# Patient Record
Sex: Female | Born: 1975 | Race: Asian | Hispanic: No | Marital: Married | Smoking: Never smoker
Health system: Southern US, Community
[De-identification: ages and names within clinical notes are randomized; demographics above are authoritative.]

## PROBLEM LIST (undated history)

## (undated) DIAGNOSIS — Z789 Other specified health status: Secondary | ICD-10-CM

## (undated) HISTORY — PX: OTHER SURGICAL HISTORY: SHX169

## (undated) HISTORY — PX: BACK SURGERY: SHX140

---

## 2002-05-07 ENCOUNTER — Encounter: Admission: RE | Admit: 2002-05-07 | Discharge: 2002-08-05 | Payer: Self-pay | Admitting: *Deleted

## 2002-08-06 ENCOUNTER — Encounter: Admission: RE | Admit: 2002-08-06 | Discharge: 2002-11-04 | Payer: Self-pay | Admitting: *Deleted

## 2003-02-21 ENCOUNTER — Encounter: Payer: Self-pay | Admitting: Nephrology

## 2003-02-21 ENCOUNTER — Encounter: Admission: RE | Admit: 2003-02-21 | Discharge: 2003-02-21 | Payer: Self-pay | Admitting: Nephrology

## 2003-06-23 ENCOUNTER — Observation Stay (HOSPITAL_COMMUNITY): Admission: EM | Admit: 2003-06-23 | Discharge: 2003-06-24 | Payer: Self-pay | Admitting: *Deleted

## 2003-06-23 ENCOUNTER — Encounter: Payer: Self-pay | Admitting: *Deleted

## 2003-06-24 ENCOUNTER — Encounter: Payer: Self-pay | Admitting: General Surgery

## 2005-05-04 ENCOUNTER — Emergency Department (HOSPITAL_COMMUNITY): Admission: EM | Admit: 2005-05-04 | Discharge: 2005-05-04 | Payer: Self-pay | Admitting: Emergency Medicine
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
## 2007-12-11 ENCOUNTER — Inpatient Hospital Stay (HOSPITAL_COMMUNITY): Admission: AD | Admit: 2007-12-11 | Discharge: 2007-12-14 | Payer: Self-pay | Admitting: Obstetrics and Gynecology

## 2009-03-06 IMAGING — CR DG ABD PORTABLE 1V
1 series · 1 of 1 positions shown · non-contrast
Comparison: none

CLINICAL DATA: 31-year-old female in labor.  Evaluate for location of spinal hardware for epidural placement.
PORTABLE ABDOMEN - 1 VIEW - 12/12/07:

[view not recorded]
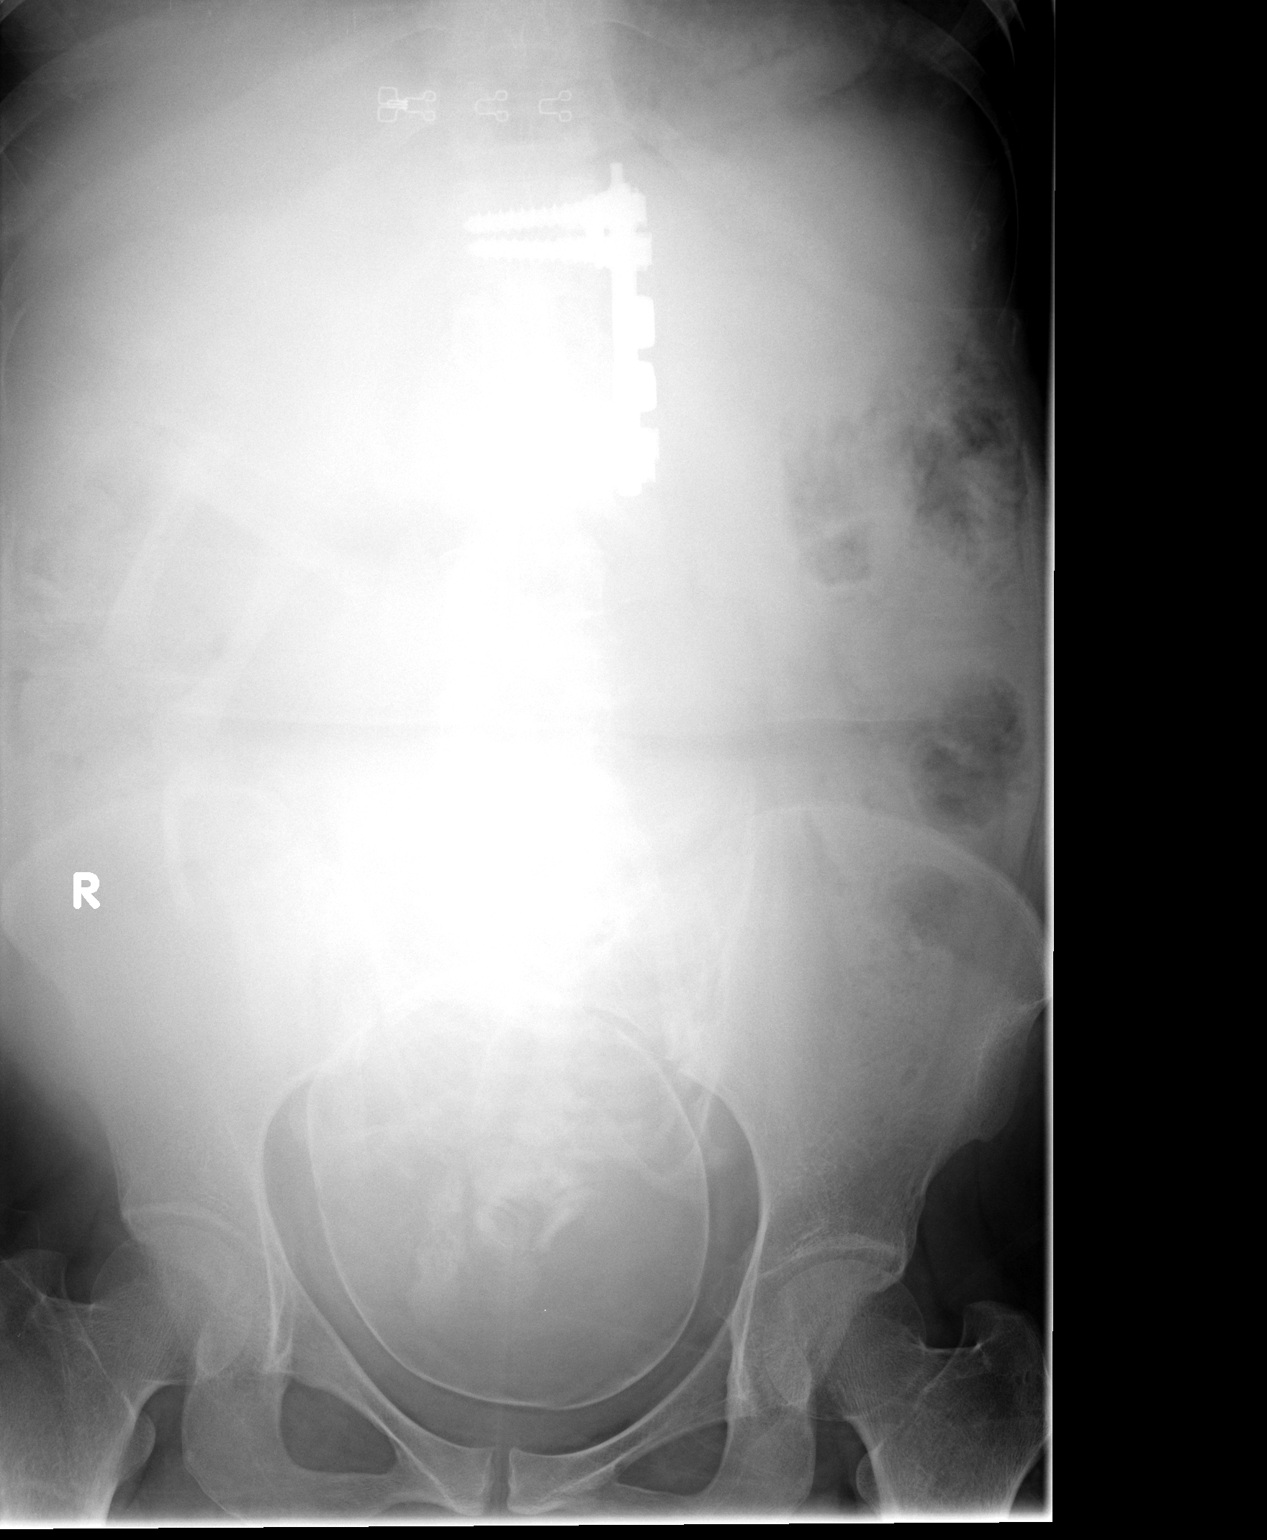

[1 of 1 positions shown; findings below may reference images not displayed]

FINDINGS: There is a fetus in vertex position.  There is spinal hardware present from a left lateral fixation with the superior fixation screws in the last rib-bearing vertebral body which will be designated T12.  The inferior fixation screws are in L2 .
IMPRESSION: Lateral fusion from T12 to L2.

## 2010-04-07 ENCOUNTER — Inpatient Hospital Stay (HOSPITAL_COMMUNITY): Admission: AD | Admit: 2010-04-07 | Discharge: 2010-04-09 | Payer: Self-pay | Admitting: Obstetrics and Gynecology

## 2011-01-14 LAB — CBC
HCT: 34.8 % — ABNORMAL LOW (ref 36.0–46.0)
Hemoglobin: 11.9 g/dL — ABNORMAL LOW (ref 12.0–15.0)
MCV: 85.9 fL (ref 78.0–100.0)
Platelets: 170 10*3/uL (ref 150–400)
RBC: 4.84 MIL/uL (ref 3.87–5.11)
WBC: 10 10*3/uL (ref 4.0–10.5)
WBC: 11.2 10*3/uL — ABNORMAL HIGH (ref 4.0–10.5)
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
## 2011-07-19 LAB — CBC
HCT: 38.6
MCHC: 33.4
MCHC: 34.2
Platelets: 183
Platelets: 217
RBC: 4.08
RDW: 20.4 — ABNORMAL HIGH
RDW: 21.1 — ABNORMAL HIGH

## 2014-07-25 ENCOUNTER — Other Ambulatory Visit (HOSPITAL_COMMUNITY): Payer: Self-pay | Admitting: Respiratory Therapy

## 2014-07-25 DIAGNOSIS — R059 Cough, unspecified: Secondary | ICD-10-CM

## 2014-07-25 DIAGNOSIS — R05 Cough: Secondary | ICD-10-CM

## 2014-08-01 ENCOUNTER — Ambulatory Visit (HOSPITAL_COMMUNITY)
Admission: RE | Admit: 2014-08-01 | Discharge: 2014-08-01 | Disposition: A | Discharge status: Home / Self Care | Payer: BC Managed Care – PPO | Source: Ambulatory Visit | Attending: Family Medicine | Admitting: Family Medicine

## 2014-08-01 DIAGNOSIS — J45909 Unspecified asthma, uncomplicated: Secondary | ICD-10-CM | POA: Insufficient documentation

## 2014-08-01 DIAGNOSIS — R05 Cough: Secondary | ICD-10-CM | POA: Diagnosis present

## 2014-08-01 DIAGNOSIS — R062 Wheezing: Secondary | ICD-10-CM | POA: Diagnosis present

## 2014-08-01 LAB — PULMONARY FUNCTION TEST
DL/VA % PRED: 155 %
DL/VA: 6.6 ml/min/mmHg/L
DLCO COR % PRED: 79 %
DLCO UNC: 14.91 ml/min/mmHg
DLCO cor: 14.91 ml/min/mmHg
DLCO unc % pred: 79 %
FEF 25-75 POST: 2.03 L/s
FEF 25-75 Pre: 0.86 L/sec
FEF2575-%CHANGE-POST: 134 %
FEF2575-%PRED-POST: 66 %
FEF2575-%PRED-PRE: 28 %
FEV1-%Change-Post: 42 %
FEV1-%PRED-PRE: 44 %
FEV1-%Pred-Post: 63 %
FEV1-PRE: 1.22 L
FEV1-Post: 1.73 L
FEV1FVC-%Change-Post: 39 %
FEV1FVC-%PRED-PRE: 76 %
FEV6-%CHANGE-POST: 1 %
FEV6-%PRED-POST: 60 %
FEV6-%Pred-Pre: 59 %
FEV6-Post: 1.94 L
FEV6-Pre: 1.92 L
FEV6FVC-%Pred-Post: 101 %
FEV6FVC-%Pred-Pre: 101 %
FVC-%Change-Post: 1 %
FVC-%PRED-POST: 59 %
FVC-%Pred-Pre: 58 %
FVC-POST: 1.94 L
FVC-Pre: 1.92 L
POST FEV1/FVC RATIO: 89 %
PRE FEV1/FVC RATIO: 63 %
Post FEV6/FVC ratio: 100 %
Pre FEV6/FVC Ratio: 100 %
RV % pred: 97 %
RV: 1.28 L
TLC % pred: 69 %
TLC: 3.08 L

## 2014-08-01 MED ORDER — ALBUTEROL SULFATE (2.5 MG/3ML) 0.083% IN NEBU
2.5000 mg | INHALATION_SOLUTION | Freq: Once | RESPIRATORY_TRACT | Status: AC
  Administered 2014-08-01: 2.5 mg via RESPIRATORY_TRACT

## 2015-08-17 LAB — OB RESULTS CONSOLE RUBELLA ANTIBODY, IGM: RUBELLA: IMMUNE

## 2015-08-17 LAB — OB RESULTS CONSOLE HEPATITIS B SURFACE ANTIGEN: Hepatitis B Surface Ag: NEGATIVE

## 2015-08-17 LAB — OB RESULTS CONSOLE GC/CHLAMYDIA
CHLAMYDIA, DNA PROBE: NEGATIVE
GC PROBE AMP, GENITAL: NEGATIVE

## 2015-08-17 LAB — OB RESULTS CONSOLE ABO/RH: RH Type: POSITIVE

## 2015-08-17 LAB — OB RESULTS CONSOLE ANTIBODY SCREEN: Antibody Screen: NEGATIVE

## 2015-08-17 LAB — OB RESULTS CONSOLE RPR: RPR: NONREACTIVE

## 2015-08-17 LAB — OB RESULTS CONSOLE HIV ANTIBODY (ROUTINE TESTING): HIV: NONREACTIVE

## 2015-10-29 NOTE — L&D Delivery Note (Signed)
Delivery Note Pt arrived on L&D completely dilated and CNM called while I was coming to hospital.  I arrived as head was crowning and observed the CNM delivered the baby without difficulty.  I then assumed care and cut the cord after one minute delay and delivered the placenta intact.  At 9:11 AM a healthy female was delivered via Vaginal, Spontaneous Delivery (Presentation: OA).  APGAR: 9, 9; weight 8 lb 7.8 oz (3850 g).   Placenta status: Intact, Spontaneous.  Cord: 3 vessels with the following complications: None.   Anesthesia: None  Episiotomy: None Lacerations: None Suture Repair: n/a Est. Blood Loss (mL): 175ml  Mom to postpartum.  Baby to Couplet care / Skin to Skin. Pt declines circumcision We discussed interval tubal again and pt desires to proceed.  Oliver PilaRICHARDSON,Divit Stipp W 03/10/2016, 10:33 AM

## 2016-02-14 LAB — OB RESULTS CONSOLE GBS: STREP GROUP B AG: NEGATIVE

## 2016-02-24 ENCOUNTER — Inpatient Hospital Stay (HOSPITAL_COMMUNITY)
Admission: AD | Admit: 2016-02-24 | Discharge: 2016-02-25 | Disposition: A | Discharge status: Home / Self Care | Payer: Medicaid Other | Source: Ambulatory Visit | Attending: Obstetrics and Gynecology | Admitting: Obstetrics and Gynecology

## 2016-02-24 ENCOUNTER — Encounter (HOSPITAL_COMMUNITY): Payer: Self-pay | Admitting: *Deleted

## 2016-02-24 DIAGNOSIS — Z3493 Encounter for supervision of normal pregnancy, unspecified, third trimester: Secondary | ICD-10-CM | POA: Diagnosis present

## 2016-02-24 HISTORY — DX: Other specified health status: Z78.9

## 2016-02-24 NOTE — MAU Note (Signed)
Went to [facility] about ago and saw pink when wiped. Some contractions. Good FM. 4cm last sve .

## 2016-02-25 DIAGNOSIS — Z3493 Encounter for supervision of normal pregnancy, unspecified, third trimester: Secondary | ICD-10-CM | POA: Diagnosis not present

## 2016-02-25 LAB — URINALYSIS, ROUTINE W REFLEX MICROSCOPIC
BILIRUBIN URINE: NEGATIVE
GLUCOSE, UA: NEGATIVE mg/dL
Hgb urine dipstick: NEGATIVE
KETONES UR: NEGATIVE mg/dL
Leukocytes, UA: NEGATIVE
NITRITE: NEGATIVE
PH: 6.5 (ref 5.0–8.0)
PROTEIN: NEGATIVE mg/dL
Specific Gravity, Urine: 1.015 (ref 1.005–1.030)

## 2016-02-25 NOTE — Progress Notes (Signed)
Dr Jackelyn KnifeMeisinger notified of pt's admission and status. Aware of ctx pattern, sve x 2, reactive FHR. Pt does not appear uncomfortable and states feels could sleep thru ctxs. Stable for d/c home.

## 2016-02-25 NOTE — Discharge Instructions (Signed)
Vaginal Delivery °During delivery, your health care provider will help you give birth to your baby. During a vaginal delivery, you will work to push the baby out of your vagina. However, before you can push your baby out, a few things need to happen. The opening of your uterus (cervix) has to soften, thin out, and open up (dilate) all the way to 10 cm. Also, your baby has to move down from the uterus into your vagina.  °SIGNS OF LABOR  °Your health care provider will first need to make sure you are in labor. Signs of labor include:  °· Passing what is called the mucous plug before labor begins. This is a small amount of blood-stained mucus. °· Having regular, painful uterine contractions.   °· The time between contractions gets shorter.   °· The discomfort and pain gradually get more intense. °· Contraction pains get worse when walking and do not go away when resting.   °· Your cervix becomes thinner (effacement) and dilates. °BEFORE THE DELIVERY °Once you are in labor and admitted into the hospital or care center, your health care provider may do the following:  °· Perform a complete physical exam. °· Review any complications related to pregnancy or labor.  °· Check your blood pressure, pulse, temperature, and heart rate (vital signs).   °· Determine if, and when, the rupture of amniotic membranes occurred. °· Do a vaginal exam (using a sterile glove and lubricant) to determine:   °¨ The position (presentation) of the baby. Is the baby's head presenting first (vertex) in the birth canal (vagina), or are the feet or buttocks first (breech)?   °¨ The level (station) of the baby's head within the birth canal.   °¨ The effacement and dilatation of the cervix.   °· An electronic fetal monitor is usually placed on your abdomen when you first arrive. This is used to monitor your contractions and the baby's heart rate. °¨ When the monitor is on your abdomen (external fetal monitor), it can only pick up the frequency and  length of your contractions. It cannot tell the strength of your contractions. °¨ If it becomes necessary for your health care provider to know exactly how strong your contractions are or to see exactly what the baby's heart rate is doing, an internal monitor may be inserted into your vagina and uterus. Your health care provider will discuss the benefits and risks of using an internal monitor and obtain your permission before inserting the device. °¨ Continuous fetal monitoring may be needed if you have an epidural, are receiving certain medicines (such as oxytocin), or have pregnancy or labor complications. °· An IV access tube may be placed into a vein in your arm to deliver fluids and medicines if necessary. °THREE STAGES OF LABOR AND DELIVERY °Normal labor and delivery is divided into three stages. °First Stage °This stage starts when you begin to contract regularly and your cervix begins to efface and dilate. It ends when your cervix is completely open (fully dilated). The first stage is the longest stage of labor and can last from 3 hours to 15 hours.  °Several methods are available to help with labor pain. You and your health care provider will decide which option is best for you. Options include:  °· Opioid medicines. These are strong pain medicines that you can get through your IV tube or as a shot into your muscle. These medicines lessen pain but do not make it go away completely.  °· Epidural. A medicine is given through a thin tube that   is inserted in your back. The medicine numbs the lower part of your body and prevents any pain in that area. °· Paracervical pain medicine. This is an injection of an anesthetic on each side of your cervix.   °· You may request natural childbirth, which does not involve the use of pain medicines or an epidural during labor and delivery. Instead, you will use other things, such as breathing exercises, to help cope with the pain. °Second Stage °The second stage of labor  begins when your cervix is fully dilated at 10 cm. It continues until you push your baby down through the birth canal and the baby is born. This stage can take only minutes or several hours. °· The location of your baby's head as it moves through the birth canal is reported as a number called a station. If the baby's head has not started its descent, the station is described as being at minus 3 (-3). When your baby's head is at the zero station, it is at the middle of the birth canal and is engaged in the pelvis. The station of your baby helps indicate the progress of the second stage of labor. °· When your baby is born, your health care provider may hold the baby with his or her head lowered to prevent amniotic fluid, mucus, and blood from getting into the baby's lungs. The baby's mouth and nose may be suctioned with a small bulb syringe to remove any additional fluid. °· Your health care provider may then place the baby on your stomach. It is important to keep the baby from getting cold. To do this, the health care provider will dry the baby off, place the baby directly on your skin (with no blankets between you and the baby), and cover the baby with warm, dry blankets.   °· The umbilical cord is cut. °Third Stage °During the third stage of labor, your health care provider will deliver the placenta (afterbirth) and make sure your bleeding is under control. The delivery of the placenta usually takes about 5 minutes but can take up to 30 minutes. After the placenta is delivered, a medicine may be given either by IV or injection to help contract the uterus and control bleeding. If you are planning to breastfeed, you can try to do so now. °After you deliver the placenta, your uterus should contract and get very firm. If your uterus does not remain firm, your health care provider will massage it. This is important because the contraction of the uterus helps cut off bleeding at the site where the placenta was attached  to your uterus. If your uterus does not contract properly and stay firm, you may continue to bleed heavily. If there is a lot of bleeding, medicines may be given to contract the uterus and stop the bleeding.  °  °This information is not intended to replace advice given to you by your health care provider. Make sure you discuss any questions you have with your health care provider. °  °Document Released: 07/23/2008 Document Revised: 11/04/2014 Document Reviewed: 06/10/2012 °Elsevier Interactive Patient Education ©2016 [employer] Inc[employer] ° °

## 2016-02-25 NOTE — Progress Notes (Signed)
Written and verbal d/c instructions given and understanding voiced. 

## 2016-03-08 ENCOUNTER — Telehealth (HOSPITAL_COMMUNITY): Payer: Self-pay | Admitting: *Deleted

## 2016-03-08 ENCOUNTER — Encounter (HOSPITAL_COMMUNITY): Payer: Self-pay | Admitting: *Deleted

## 2016-03-08 NOTE — Telephone Encounter (Signed)
Preadmission screen [phone] interpreter number

## 2016-03-10 ENCOUNTER — Inpatient Hospital Stay (HOSPITAL_COMMUNITY)
Admission: AD | Admit: 2016-03-10 | Discharge: 2016-03-11 | DRG: 775 | Disposition: A | Discharge status: Home / Self Care | Payer: Medicaid Other | Source: Ambulatory Visit | Attending: Obstetrics and Gynecology | Admitting: Obstetrics and Gynecology

## 2016-03-10 ENCOUNTER — Encounter (HOSPITAL_COMMUNITY): Payer: Self-pay

## 2016-03-10 DIAGNOSIS — Z3A39 39 weeks gestation of pregnancy: Secondary | ICD-10-CM | POA: Diagnosis not present

## 2016-03-10 DIAGNOSIS — Z8249 Family history of ischemic heart disease and other diseases of the circulatory system: Secondary | ICD-10-CM | POA: Diagnosis not present

## 2016-03-10 DIAGNOSIS — Z833 Family history of diabetes mellitus: Secondary | ICD-10-CM

## 2016-03-10 LAB — CBC
HCT: 39.1 % (ref 36.0–46.0)
Hemoglobin: 12.8 g/dL (ref 12.0–15.0)
MCH: 26.4 pg (ref 26.0–34.0)
MCHC: 32.7 g/dL (ref 30.0–36.0)
MCV: 80.8 fL (ref 78.0–100.0)
PLATELETS: 168 10*3/uL (ref 150–400)
RBC: 4.84 MIL/uL (ref 3.87–5.11)
RDW: 15.3 % (ref 11.5–15.5)
WBC: 13.6 10*3/uL — ABNORMAL HIGH (ref 4.0–10.5)

## 2016-03-10 LAB — ABO/RH: ABO/RH(D): AB POS

## 2016-03-10 LAB — TYPE AND SCREEN
ABO/RH(D): AB POS
Antibody Screen: NEGATIVE

## 2016-03-10 MED ORDER — LACTATED RINGERS IV SOLN: INTRAVENOUS | Status: DC

## 2016-03-10 MED ORDER — ONDANSETRON HCL 4 MG/2ML IJ SOLN: 4.0000 mg | INTRAMUSCULAR | Status: DC | PRN

## 2016-03-10 MED ORDER — SENNOSIDES-DOCUSATE SODIUM 8.6-50 MG PO TABS
2.0000 | ORAL_TABLET | ORAL | Status: DC
Start: 2016-03-11 — End: 2016-03-11
  Administered 2016-03-10: 2 via ORAL
  Filled 2016-03-10: qty 2

## 2016-03-10 MED ORDER — ONDANSETRON HCL 4 MG/2ML IJ SOLN: 4.0000 mg | Freq: Four times a day (QID) | INTRAMUSCULAR | Status: DC | PRN

## 2016-03-10 MED ORDER — OXYTOCIN 40 UNITS IN LACTATED RINGERS INFUSION - SIMPLE MED: 2.5000 [IU]/h | INTRAVENOUS | Status: DC

## 2016-03-10 MED ORDER — COCONUT OIL OIL: 1.0000 "application " | TOPICAL_OIL | Status: DC | PRN

## 2016-03-10 MED ORDER — LACTATED RINGERS IV SOLN: 500.0000 mL | INTRAVENOUS | Status: DC | PRN

## 2016-03-10 MED ORDER — DIPHENHYDRAMINE HCL 25 MG PO CAPS: 25.0000 mg | ORAL_CAPSULE | Freq: Four times a day (QID) | ORAL | Status: DC | PRN

## 2016-03-10 MED ORDER — FLEET ENEMA 7-19 GM/118ML RE ENEM: 1.0000 | ENEMA | Freq: Once | RECTAL | Status: DC

## 2016-03-10 MED ORDER — LIDOCAINE HCL (PF) 1 % IJ SOLN
30.0000 mL | INTRAMUSCULAR | Status: DC | PRN
  Filled 2016-03-10: qty 30

## 2016-03-10 MED ORDER — IBUPROFEN 600 MG PO TABS
600.0000 mg | ORAL_TABLET | Freq: Four times a day (QID) | ORAL | Status: DC
  Administered 2016-03-10 – 2016-03-11 (×5): 600 mg via ORAL
  Filled 2016-03-10 (×5): qty 1

## 2016-03-10 MED ORDER — ACETAMINOPHEN 325 MG PO TABS: 650.0000 mg | ORAL_TABLET | ORAL | Status: DC | PRN

## 2016-03-10 MED ORDER — BENZOCAINE-MENTHOL 20-0.5 % EX AERO: 1.0000 "application " | INHALATION_SPRAY | CUTANEOUS | Status: DC | PRN

## 2016-03-10 MED ORDER — WITCH HAZEL-GLYCERIN EX PADS: 1.0000 "application " | MEDICATED_PAD | CUTANEOUS | Status: DC | PRN

## 2016-03-10 MED ORDER — DIBUCAINE 1 % RE OINT: 1.0000 "application " | TOPICAL_OINTMENT | RECTAL | Status: DC | PRN

## 2016-03-10 MED ORDER — SIMETHICONE 80 MG PO CHEW: 80.0000 mg | CHEWABLE_TABLET | ORAL | Status: DC | PRN

## 2016-03-10 MED ORDER — OXYTOCIN 10 UNIT/ML IJ SOLN
INTRAMUSCULAR | Status: AC
  Administered 2016-03-10: 10 [IU] via INTRAMUSCULAR
  Filled 2016-03-10: qty 1

## 2016-03-10 MED ORDER — OXYTOCIN 10 UNIT/ML IJ SOLN: 10.0000 [IU] | Freq: Once | INTRAMUSCULAR | Status: DC

## 2016-03-10 MED ORDER — PRENATAL MULTIVITAMIN CH
1.0000 | ORAL_TABLET | Freq: Every day | ORAL | Status: DC
  Administered 2016-03-10 – 2016-03-11 (×2): 1 via ORAL
  Filled 2016-03-10 (×2): qty 1

## 2016-03-10 MED ORDER — CITRIC ACID-SODIUM CITRATE 334-500 MG/5ML PO SOLN: 30.0000 mL | ORAL | Status: DC | PRN

## 2016-03-10 MED ORDER — TETANUS-DIPHTH-ACELL PERTUSSIS 5-2.5-18.5 LF-MCG/0.5 IM SUSP: 0.5000 mL | Freq: Once | INTRAMUSCULAR | Status: DC

## 2016-03-10 MED ORDER — OXYCODONE-ACETAMINOPHEN 5-325 MG PO TABS: 1.0000 | ORAL_TABLET | ORAL | Status: DC | PRN

## 2016-03-10 MED ORDER — ONDANSETRON HCL 4 MG PO TABS: 4.0000 mg | ORAL_TABLET | ORAL | Status: DC | PRN

## 2016-03-10 MED ORDER — ZOLPIDEM TARTRATE 5 MG PO TABS: 5.0000 mg | ORAL_TABLET | Freq: Every evening | ORAL | Status: DC | PRN

## 2016-03-10 MED ORDER — OXYTOCIN BOLUS FROM INFUSION: 500.0000 mL | INTRAVENOUS | Status: DC

## 2016-03-10 MED ORDER — OXYCODONE-ACETAMINOPHEN 5-325 MG PO TABS: 2.0000 | ORAL_TABLET | ORAL | Status: DC | PRN

## 2016-03-10 NOTE — MAU Note (Signed)
Pt presents to MAU with contractions that started at 2 am. Pt presents completely dilated

## 2016-03-10 NOTE — H&P (Addendum)
[name] is a 40 y.o. female G3P2002 at 3339 5/7 weeks (EDD 03/12/16 by LMP c/w 8 week US) presenting to MAU completely dilated and taken immediately to L&D where she delivered within 15 minutes of arrival.  Prenatal care significant for AMA with normal Panorama screen, low risk female.  She expressed a desire for permanent sterilization at [redacted] weeks gestation and signed BTL papers 02/13/16.  We discussed this would likely be an interval tubal and she was fine with that plan.      Maternal Medical History:  Reason for admission: Contractions.   Contractions: Onset was 3-5 hours ago.   Frequency: regular.   Perceived severity is strong.    Fetal activity: Perceived fetal activity is normal.    Prenatal Complications - Diabetes: none.    OB History    Gravida Para Term Preterm AB TAB SAB Ectopic Multiple Living   3 2 2       2     2009 7#  NSVD 2011 7#12oz NSVD  Past Medical History  Diagnosis Date  . Medical history non-contributory    Past Surgical History  Procedure Laterality Date  . Back surgery    . Tummy tuck     Family History: family history includes Cancer in her mother; Diabetes in her father; Hypertension in her father. Social History:  reports that she has never smoked. She has never used smokeless tobacco. She reports that she does not drink alcohol or use illicit drugs.   Prenatal Transfer Tool  Maternal Diabetes: No Genetic Screening: Normal Maternal Ultrasounds/Referrals: Normal Fetal Ultrasounds or other Referrals:  None Maternal Substance Abuse:  No Significant Maternal Medications:  None Significant Maternal Lab Results:  None Other Comments:  None  Review of Systems  Gastrointestinal: Positive for abdominal pain.    Dilation: 10 Exam by:: Dorisann FramesAmanda Jones RN Blood pressure 125/74, pulse 76, temperature 98.2 F (36.8 C), temperature source Oral, resp. rate 18, height 5\' 2"  (1.575 m), weight 178 lb (80.74 kg). Maternal Exam:  Uterine Assessment:  Contraction strength is firm.  Contraction frequency is regular.   Abdomen: Fetal presentation: vertex  Introitus: Normal vulva. Normal vagina.    Physical Exam  Constitutional: She is oriented to person, place, and time. She appears well-developed and well-nourished.  Cardiovascular: Normal rate.   Respiratory: Effort normal.  GI: Soft.  Genitourinary: Vagina normal.  Neurological: She is alert and oriented to person, place, and time.  Psychiatric: She has a normal mood and affect.    Prenatal labs: ABO, Rh: AB/Positive/-- (10/20 0000) Antibody: Negative (10/20 0000) Rubella: Immune (10/20 0000) RPR: Nonreactive (10/20 0000)  HBsAg: Negative (10/20 0000)  HIV: Non-reactive (10/20 0000)  GBS: Negative (04/19 0000)  One hour GCT 154 Three hour GTT WNL Panorama and AFP WNL  Assessment/Plan: Pt arrived and precipitously delivered se that note.  Plan interval BTL.   Oliver PilaRICHARDSON,Yahya Boldman W 03/10/2016, 10:23 AM

## 2016-03-10 NOTE — Progress Notes (Signed)
Dr Senaida Oresichardson notified of pt VE complete, orders received

## 2016-03-11 LAB — CBC
HEMATOCRIT: 36.3 % (ref 36.0–46.0)
Hemoglobin: 11.8 g/dL — ABNORMAL LOW (ref 12.0–15.0)
MCH: 25.9 pg — AB (ref 26.0–34.0)
MCHC: 32.5 g/dL (ref 30.0–36.0)
MCV: 79.8 fL (ref 78.0–100.0)
PLATELETS: 161 10*3/uL (ref 150–400)
RBC: 4.55 MIL/uL (ref 3.87–5.11)
RDW: 15.6 % — ABNORMAL HIGH (ref 11.5–15.5)
WBC: 10.4 10*3/uL (ref 4.0–10.5)

## 2016-03-11 LAB — RPR: RPR Ser Ql: NONREACTIVE

## 2016-03-11 MED ORDER — IBUPROFEN 600 MG PO TABS: 600.0000 mg | ORAL_TABLET | Freq: Four times a day (QID) | ORAL | Status: AC

## 2016-03-11 NOTE — Discharge Summary (Signed)
OB Discharge Summary     Patient Name: [name] DOB: Mar 06, 1976 MRN: [id number]  Date of admission: 03/10/2016 Delivering MD: Zerita BoersLAWSON, DARLENE   Date of discharge: 03/11/2016  Admitting diagnosis: 39WKS CONTRACTIONS BLEEDING  Intrauterine pregnancy: 8667w5d     Secondary diagnosis:  Active Problems:   Labor and delivery indication for care or intervention   Precipitous delivery  Additional problems: none     Discharge diagnosis: Term Pregnancy Delivered                                                                                                Post partum procedures:none  Augmentation: none  Complications: None  Hospital course:  Onset of Labor With Vaginal Delivery     40 y.o. yo G3P3003 at 1167w5d was admitted in Active Labor on 03/10/2016. Patient had an uncomplicated labor course as follows:  Membrane Rupture Time/Date: 9:09 AM ,03/10/2016   Intrapartum Procedures: Episiotomy: None [1]                                         Lacerations:  None [1]  Patient had a delivery of a Viable infant.  Precipitous delivery 03/10/2016  Information for the patient's newborn:  Hyman Hopeshan, Boy Richie [[id number]][[id number]]  Delivery Method: Vaginal, Spontaneous Delivery (Filed from Delivery Summary)    Pateint had an uncomplicated postpartum course.  She is ambulating, tolerating a regular diet, passing flatus, and urinating well. Patient is discharged home in stable condition on 03/11/2016.    Physical exam  Filed Vitals:   03/10/16 1033 03/10/16 1133 03/10/16 1510 03/11/16 0606  BP: 128/72 122/73 107/63 109/64  Pulse: 78 76 75 71  Temp: 98.2 F (36.8 C) 99 F (37.2 C) 98.9 F (37.2 C) 98.2 F (36.8 C)  TempSrc: Oral Oral Oral Oral  Resp: 18 18 20 18   Height:      Weight:      SpO2:  98% 98%    General: alert and cooperative Lochia: appropriate Uterine Fundus: firm  Labs: Lab Results  Component Value Date   WBC 10.4 03/11/2016   HGB 11.8* 03/11/2016   HCT 36.3 03/11/2016   MCV  79.8 03/11/2016   PLT 161 03/11/2016   No flowsheet data found.  Discharge instruction: per After Visit Summary and "Baby and Me Booklet".  After visit meds:    Medication List    TAKE these medications        ibuprofen 600 MG tablet  Commonly known as:  ADVIL,MOTRIN  Take 1 tablet (600 mg total) by mouth every 6 (six) hours.     loratadine 10 MG tablet  Commonly known as:  CLARITIN  Take 10 mg by mouth daily.     prenatal multivitamin Tabs tablet  Take 1 tablet by mouth daily at 12 noon.        Diet: routine diet  Activity: Advance as tolerated. Pelvic rest for 6 weeks.   Outpatient follow up:6 weeks Follow up Appt:No future appointments. Follow up Visit:No Follow-up  on file.  Postpartum contraception: Tubal Ligation planned  Newborn Data: Live born female  Birth Weight: 8 lb 7.8 oz (3850 g) APGAR: 9, 9  Baby Feeding: Breast Disposition:home with mother   03/11/2016 Oliver Pila, MD

## 2016-03-11 NOTE — Progress Notes (Signed)
Post Partum Day 1 Subjective: no complaints, up ad lib and tolerating PO  Objective: Blood pressure 109/64, pulse 71, temperature 98.2 F (36.8 C), temperature source Oral, resp. rate 18, height 5\' 2"  (1.575 m), weight 178 lb (80.74 kg), SpO2 98 %, unknown if currently breastfeeding.  Physical Exam:  General: alert and cooperative Lochia: appropriate Uterine Fundus: firm    Recent Labs  03/10/16 0942 03/11/16 0539  HGB 12.8 11.8*  HCT 39.1 36.3    Assessment/Plan: Discharge home if baby able to go   LOS: 1 day   Alleen Kehm W 03/11/2016, 8:55 AM

## 2016-03-12 ENCOUNTER — Inpatient Hospital Stay (HOSPITAL_COMMUNITY): Admission: RE | Admit: 2016-03-12 | Payer: Medicaid Other | Source: Ambulatory Visit

## 2016-04-18 NOTE — Patient Instructions (Signed)
Your procedure is scheduled on:  Wednesday, May 01, 2016  [facility]:  6:00 AM  Pick up the phone at the desk and dial [phone].  Call this number if you have problems the morning of surgery: [phone].  Remember: Do NOT eat food or drink after:  Midnight Tuesday, April 30, 2016  Take these medicines the morning of surgery with a SIP OF WATER:  None  Do NOT wear jewelry (body piercing), metal hair clips/bobby pins, make-up, or nail polish. Do NOT wear lotions, powders, or perfumes.  You may wear deodorant. Do NOT shave for 48 hours prior to surgery. Do NOT bring valuables to the hospital. Contacts, dentures, or bridgework may not be worn into surgery.  Have a responsible adult drive you home and stay with you for 24 hours after your procedure

## 2016-04-22 ENCOUNTER — Encounter (HOSPITAL_COMMUNITY)
Admission: RE | Admit: 2016-04-22 | Discharge: 2016-04-22 | Disposition: A | Discharge status: Home / Self Care | Payer: Medicaid Other | Source: Ambulatory Visit | Attending: Obstetrics and Gynecology | Admitting: Obstetrics and Gynecology

## 2016-04-22 ENCOUNTER — Encounter (HOSPITAL_COMMUNITY): Payer: Self-pay

## 2016-04-22 DIAGNOSIS — Z01812 Encounter for preprocedural laboratory examination: Secondary | ICD-10-CM | POA: Insufficient documentation

## 2016-04-22 LAB — CBC
HEMATOCRIT: 40.1 % (ref 36.0–46.0)
HEMOGLOBIN: 13.3 g/dL (ref 12.0–15.0)
MCH: 26.5 pg (ref 26.0–34.0)
MCHC: 33.2 g/dL (ref 30.0–36.0)
MCV: 80 fL (ref 78.0–100.0)
Platelets: 224 10*3/uL (ref 150–400)
RBC: 5.01 MIL/uL (ref 3.87–5.11)
RDW: 16.4 % — AB (ref 11.5–15.5)
WBC: 6.7 10*3/uL (ref 4.0–10.5)

## 2016-04-22 LAB — TYPE AND SCREEN
ABO/RH(D): AB POS
ANTIBODY SCREEN: NEGATIVE

## 2016-04-30 NOTE — H&P (Signed)
[name] is an 40 y.o. female G3P3 who presents for scheduled laparoscopic tubal fulguration.  Pt desires permanent sterilization and is s/p the recent delivery of her  third child.  She is healthy otherwise.  She is bottlefeeding.  Pertinent Gynecological History: OB History: NSVD x 3   Menstrual History:  No LMP recorded.    Past Medical History  Diagnosis Date  . Medical history non-contributory     Past Surgical History  Procedure Laterality Date  . Back surgery    . Tummy tuck      Family History  Problem Relation Age of Onset  . Cancer Mother   . Hypertension Father   . Diabetes Father     Social History:  reports that she has never smoked. She has never used smokeless tobacco. She reports that she does not drink alcohol or use illicit drugs.  Allergies: No Known Allergies  No prescriptions prior to admission    Review of Systems  Gastrointestinal: Negative for abdominal pain.    not currently breastfeeding. Physical Exam  Constitutional: She appears well-developed.  Cardiovascular: Normal rate and regular rhythm.   Respiratory: Effort normal.  GI: Soft.  Abdominoplasty scar  Genitourinary: Vagina normal.  Neurological: She is alert.  Psychiatric: She has a normal mood and affect.    No results found for this or any previous visit (from the past 24 hour(s)).  No results found.  Assessment/Plan: The pt was counseled on  laparoscopic tubal fulguration in detail. d/Rowe her risks and benefits including bleeding, infection, and possible damage to bowel and bladder. We discussed that should a complication arise she might need a larger incision and have a much longer recovery. We discussed the risk of failure of 1/100 with pregnancy occuring and an increased risk of ectopic pregnancy should she conceive after the surgery. Pt desires to proceed.   Oliver PilaRICHARDSON,[name] 04/30/2016, 11:38 AM

## 2016-04-30 NOTE — Anesthesia Preprocedure Evaluation (Addendum)
Anesthesia Evaluation  Patient identified by MRN, date of birth, ID band Patient awake    Reviewed: Allergy & Precautions, H&P , NPO status , Patient's Chart, lab work & pertinent test results  History of Anesthesia Complications Negative for: history of anesthetic complications  Airway Mallampati: II  TM Distance: >3 FB Neck ROM: full    Dental no notable dental hx.    Pulmonary neg pulmonary ROS,    Pulmonary exam normal breath sounds clear to auscultation       Cardiovascular negative cardio ROS Normal cardiovascular exam Rhythm:regular Rate:Normal     Neuro/Psych negative neurological ROS     GI/Hepatic negative GI ROS, Neg liver ROS,   Endo/Other  negative endocrine ROS  Renal/GU negative Renal ROS     Musculoskeletal   Abdominal   Peds  Hematology negative hematology ROS (+)   Anesthesia Other Findings   Reproductive/Obstetrics negative OB ROS                             Anesthesia Physical Anesthesia Plan  ASA: I  Anesthesia Plan: General   Post-op Pain Management:    Induction: Intravenous  Airway Management Planned: Oral ETT  Additional Equipment:   Intra-op Plan:   Post-operative Plan: Extubation in OR  Informed Consent: I have reviewed the patients History and Physical, chart, labs and discussed the procedure including the risks, benefits and alternatives for the proposed anesthesia with the patient or authorized representative who has indicated his/her understanding and acceptance.   Dental Advisory Given  Plan Discussed with: Anesthesiologist, CRNA and Surgeon  Anesthesia Plan Comments: (Risks/benefits of general anesthesia discussed with patient including risk of damage to teeth, lips, gum, and tongue, nausea/vomiting, allergic reactions to medications, and the possibility of heart attack, stroke and death.  All patient questions answered.  Patient wishes to  proceed.  **[place] interpreter used during pre-op evaluation.**)       Anesthesia Quick Evaluation

## 2016-05-01 ENCOUNTER — Ambulatory Visit (HOSPITAL_COMMUNITY): Payer: Medicaid Other | Admitting: Anesthesiology

## 2016-05-01 ENCOUNTER — Encounter (HOSPITAL_COMMUNITY)
Admission: RE | Disposition: A | Discharge status: Home / Self Care | Payer: Self-pay | Source: Ambulatory Visit | Attending: Obstetrics and Gynecology

## 2016-05-01 ENCOUNTER — Encounter (HOSPITAL_COMMUNITY): Payer: Self-pay

## 2016-05-01 ENCOUNTER — Ambulatory Visit (HOSPITAL_COMMUNITY)
Admission: RE | Admit: 2016-05-01 | Discharge: 2016-05-01 | Disposition: A | Discharge status: Home / Self Care | Payer: Medicaid Other | Source: Ambulatory Visit | Attending: Obstetrics and Gynecology | Admitting: Obstetrics and Gynecology

## 2016-05-01 DIAGNOSIS — Z302 Encounter for sterilization: Secondary | ICD-10-CM | POA: Insufficient documentation

## 2016-05-01 HISTORY — PX: LAPAROSCOPIC TUBAL LIGATION: SHX1937

## 2016-05-01 LAB — PREGNANCY, URINE: PREG TEST UR: NEGATIVE

## 2016-05-01 SURGERY — LIGATION, FALLOPIAN TUBE, LAPAROSCOPIC
Anesthesia: General | Site: Abdomen | Laterality: Bilateral

## 2016-05-01 MED ORDER — FENTANYL CITRATE (PF) 100 MCG/2ML IJ SOLN: 25.0000 ug | INTRAMUSCULAR | Status: DC | PRN

## 2016-05-01 MED ORDER — PROMETHAZINE HCL 25 MG/ML IJ SOLN: 6.2500 mg | INTRAMUSCULAR | Status: DC | PRN

## 2016-05-01 MED ORDER — SCOPOLAMINE 1 MG/3DAYS TD PT72
1.0000 | MEDICATED_PATCH | Freq: Once | TRANSDERMAL | Status: DC
  Administered 2016-05-01: 1.5 mg via TRANSDERMAL

## 2016-05-01 MED ORDER — LIDOCAINE HCL (CARDIAC) 20 MG/ML IV SOLN
INTRAVENOUS | Status: AC
  Filled 2016-05-01: qty 5

## 2016-05-01 MED ORDER — DEXAMETHASONE SODIUM PHOSPHATE 10 MG/ML IJ SOLN
INTRAMUSCULAR | Status: DC | PRN
  Administered 2016-05-01: 10 mg via INTRAVENOUS

## 2016-05-01 MED ORDER — ONDANSETRON HCL 4 MG/2ML IJ SOLN
INTRAMUSCULAR | Status: AC
  Filled 2016-05-01: qty 2

## 2016-05-01 MED ORDER — PROPOFOL 10 MG/ML IV BOLUS
INTRAVENOUS | Status: AC
  Filled 2016-05-01: qty 20

## 2016-05-01 MED ORDER — FENTANYL CITRATE (PF) 250 MCG/5ML IJ SOLN
INTRAMUSCULAR | Status: AC
  Filled 2016-05-01: qty 5

## 2016-05-01 MED ORDER — SUGAMMADEX SODIUM 200 MG/2ML IV SOLN
INTRAVENOUS | Status: AC
  Filled 2016-05-01: qty 2

## 2016-05-01 MED ORDER — OXYCODONE-ACETAMINOPHEN 5-325 MG PO TABS: 1.0000 | ORAL_TABLET | ORAL | Status: AC | PRN

## 2016-05-01 MED ORDER — ONDANSETRON HCL 4 MG/2ML IJ SOLN
INTRAMUSCULAR | Status: DC | PRN
  Administered 2016-05-01: 4 mg via INTRAVENOUS

## 2016-05-01 MED ORDER — SUGAMMADEX SODIUM 200 MG/2ML IV SOLN
INTRAVENOUS | Status: DC | PRN
  Administered 2016-05-01: 134.2 mg via INTRAVENOUS

## 2016-05-01 MED ORDER — KETOROLAC TROMETHAMINE 30 MG/ML IJ SOLN
INTRAMUSCULAR | Status: DC | PRN
  Administered 2016-05-01: 30 mg via INTRAVENOUS

## 2016-05-01 MED ORDER — BUPIVACAINE HCL (PF) 0.25 % IJ SOLN
INTRAMUSCULAR | Status: DC | PRN
  Administered 2016-05-01: 5 mL

## 2016-05-01 MED ORDER — KETOROLAC TROMETHAMINE 30 MG/ML IJ SOLN
INTRAMUSCULAR | Status: AC
  Filled 2016-05-01: qty 1

## 2016-05-01 MED ORDER — BUPIVACAINE HCL (PF) 0.25 % IJ SOLN
INTRAMUSCULAR | Status: AC
  Filled 2016-05-01: qty 30

## 2016-05-01 MED ORDER — ESMOLOL HCL 100 MG/10ML IV SOLN
INTRAVENOUS | Status: AC
  Filled 2016-05-01: qty 10

## 2016-05-01 MED ORDER — SODIUM CHLORIDE 0.9 % IJ SOLN
INTRAMUSCULAR | Status: AC
  Filled 2016-05-01: qty 10

## 2016-05-01 MED ORDER — ROCURONIUM BROMIDE 100 MG/10ML IV SOLN
INTRAVENOUS | Status: AC
  Filled 2016-05-01: qty 1

## 2016-05-01 MED ORDER — SCOPOLAMINE 1 MG/3DAYS TD PT72
MEDICATED_PATCH | TRANSDERMAL | Status: AC
  Administered 2016-05-01: 1.5 mg via TRANSDERMAL
  Filled 2016-05-01: qty 1

## 2016-05-01 MED ORDER — MIDAZOLAM HCL 2 MG/2ML IJ SOLN
INTRAMUSCULAR | Status: AC
  Filled 2016-05-01: qty 2

## 2016-05-01 MED ORDER — LACTATED RINGERS IV SOLN: INTRAVENOUS | Status: DC

## 2016-05-01 MED ORDER — PROPOFOL 10 MG/ML IV BOLUS
INTRAVENOUS | Status: DC | PRN
  Administered 2016-05-01: 150 mg via INTRAVENOUS

## 2016-05-01 MED ORDER — ROCURONIUM BROMIDE 100 MG/10ML IV SOLN
INTRAVENOUS | Status: DC | PRN
  Administered 2016-05-01: 50 mg via INTRAVENOUS

## 2016-05-01 MED ORDER — MIDAZOLAM HCL 5 MG/5ML IJ SOLN
INTRAMUSCULAR | Status: DC | PRN
  Administered 2016-05-01: 2 mg via INTRAVENOUS

## 2016-05-01 MED ORDER — FENTANYL CITRATE (PF) 100 MCG/2ML IJ SOLN
INTRAMUSCULAR | Status: DC | PRN
  Administered 2016-05-01 (×3): 50 ug via INTRAVENOUS
  Administered 2016-05-01: 100 ug via INTRAVENOUS

## 2016-05-01 MED ORDER — DEXAMETHASONE SODIUM PHOSPHATE 10 MG/ML IJ SOLN
INTRAMUSCULAR | Status: AC
  Filled 2016-05-01: qty 1

## 2016-05-01 MED ORDER — LACTATED RINGERS IV SOLN
INTRAVENOUS | Status: DC
  Administered 2016-05-01 (×2): via INTRAVENOUS

## 2016-05-01 MED ORDER — LIDOCAINE HCL (CARDIAC) 20 MG/ML IV SOLN
INTRAVENOUS | Status: DC | PRN
  Administered 2016-05-01: 100 mg via INTRAVENOUS

## 2016-05-01 MED ORDER — SODIUM CHLORIDE 0.9 % IJ SOLN
INTRAMUSCULAR | Status: DC | PRN
  Administered 2016-05-01: 10 mL

## 2016-05-01 SURGICAL SUPPLY — 20 items
CATH ROBINSON RED A/P 16FR (CATHETERS) ×3 IMPLANT
CLOTH BEACON ORANGE TIMEOUT ST (SAFETY) ×3 IMPLANT
DRSG OPSITE POSTOP 3X4 (GAUZE/BANDAGES/DRESSINGS) ×3 IMPLANT
DURAPREP 26ML APPLICATOR (WOUND CARE) ×3 IMPLANT
GLOVE BIO SURGEON STRL SZ 6.5 (GLOVE) ×2 IMPLANT
GLOVE BIO SURGEONS STRL SZ 6.5 (GLOVE) ×1
GLOVE BIOGEL PI IND STRL 7.0 (GLOVE) ×1 IMPLANT
GLOVE BIOGEL PI INDICATOR 7.0 (GLOVE) ×2
GOWN STRL REUS W/TWL LRG LVL3 (GOWN DISPOSABLE) ×6 IMPLANT
LIQUID BAND (GAUZE/BANDAGES/DRESSINGS) ×3 IMPLANT
NEEDLE INSUFFLATION 120MM (ENDOMECHANICALS) ×3 IMPLANT
PACK LAPAROSCOPY BASIN (CUSTOM PROCEDURE TRAY) ×3 IMPLANT
PAD TRENDELENBURG POSITION (MISCELLANEOUS) ×3 IMPLANT
SUT VIC AB 3-0 PS2 18 (SUTURE)
SUT VIC AB 3-0 PS2 18XBRD (SUTURE) IMPLANT
SUT VICRYL 0 UR6 27IN ABS (SUTURE) ×3 IMPLANT
TOWEL OR 17X24 6PK STRL BLUE (TOWEL DISPOSABLE) ×6 IMPLANT
TROCAR XCEL NON-BLD 11X100MML (ENDOMECHANICALS) ×3 IMPLANT
WARMER LAPAROSCOPE (MISCELLANEOUS) ×3 IMPLANT
WATER STERILE IRR 1000ML POUR (IV SOLUTION) ×3 IMPLANT

## 2016-05-01 NOTE — Op Note (Signed)
Operative Note    Preoperative Diagnosis Desires permanent sterilization  Postoperative Diagnosis same  Procedure Laparoscopic bilateral tubal fulguration  Surgeon Huel CoteKathy Markeis Allman, MD   Anesthesia GETA  Fluids: EBL <50cc UOP straight cath 150cc prior to procedure IVF 1200cc LR  Findings Normal pelvic anatomy and upper abdominal anatomy.  Abdominal wall distorted and scarred from prior abdominoplasty  Specimen none  Procedure Note Procedure  Patient was taken to the operating room where general anesthesia was obtained without difficulty. She was then prepped and draped in the normal sterile fashion in the dorsal lithotomy position. An appropriate timeout was performed. A speculum was then placed within the vagina and a Hulka tenaculum placed within the cervix for uterine manipulation. The bladder was emptied. Attention was then turned to the patient's abdomen after draping where the infraumbilical area was injected with approximately 10 cc of quarter percent Marcaine. A 1 cm incision was then made within the umbilicus and the varies needle was attempted to be introduced into the peritoneal cavity. Intraperitoneal placement was not confirmed as pressure remained high, so the fascia was elevated with coker clamps and opened sharply.  The 10/11 optiview trocar was then watched in with direct visual entry.    Gas flow was then applied and a pneumoperitoneum obtained with approximate 3 L of CO2 gas. All areas immediately under the entry site were visualized and no injuries noted.   With patient in Trendelenburg the uterus and tubes and ovaries were inspected with findings as previously stated. A bipolar cautery was then introduced through the operative scope and the left fallopian tube grasped approximately 3 cm from the uterine cornua and cauterized in 2 sequential areas with good blanching noted. Attention was then turned to the right fallopian tube which was likewise grasped proximally  3 cm from the uterine cornua and cauterized into sequential spots with good blanching noted there as well. The remainder of the pelvis and abdomen were inspected and found to be normal in a four quadrant inspection. The instruments were removed from the abdomen and the pneumoperitoneum reduced through the trocar. The trocar was finally removed and the infraumbilical incision closed with one deep stitch of 0 Vicryl and a subcuticular stitch of 3-0 Vicryl. Liquiband and a bandage were placed. The Hulka tenaculum was removed. Patient was then awakened and taken to the recovery room in good condition.

## 2016-05-01 NOTE — Transfer of Care (Signed)
Immediate Anesthesia Transfer of Care Note  Patient: [name]  Procedure(s) Performed: Procedure(s): BILATERAL LAPAROSCOPIC TUBAL LIGATION (Bilateral)  Patient Location: PACU  Anesthesia Type:General  Level of Consciousness: sedated  Airway & Oxygen Therapy: Patient Spontanous Breathing and Patient connected to nasal cannula oxygen  Post-op Assessment: Report given to RN and Post -op Vital signs reviewed and stable  Post vital signs: Reviewed and stable  Last Vitals:  Filed Vitals:   05/01/16 0601  BP: 125/85  Pulse: 65  Temp: 36.5 C  Resp: 16    Last Pain: There were no vitals filed for this visit.    Patients Stated Pain Goal: 3 (05/01/16 0601)  Complications: No apparent anesthesia complications

## 2016-05-01 NOTE — Progress Notes (Signed)
Patient ID: [name], female   DOB: Mar 04, 1976, 40 y.o.   MRN: [id number] Reviewed the procedure in detail with the patient again with translator. Reviewed risk of failure and risk of ectopic should she become pregnant with translator.  No sex since delivery.  Ready to proceed.

## 2016-05-01 NOTE — Anesthesia Procedure Notes (Signed)
Procedure Name: Intubation Date/Time: 05/01/2016 7:23 AM Performed by: Junious SilkGILBERT, Shannette Tabares Pre-anesthesia Checklist: Patient identified, Emergency Drugs available, Suction available, Patient being monitored and Timeout performed Patient Re-evaluated:Patient Re-evaluated prior to inductionOxygen Delivery Method: Circle system utilized Preoxygenation: Pre-oxygenation with 100% oxygen Intubation Type: IV induction Ventilation: Mask ventilation without difficulty Laryngoscope Size: Miller and 2 Grade View: Grade I Tube type: Oral Tube size: 7.0 mm Number of attempts: 1 Airway Equipment and Method: Stylet Placement Confirmation: ETT inserted through vocal cords under direct vision,  positive ETCO2,  CO2 detector and breath sounds checked- equal and bilateral Secured at: 21 cm Tube secured with: Tape Dental Injury: Teeth and Oropharynx as per pre-operative assessment

## 2016-05-01 NOTE — Discharge Instructions (Signed)

## 2016-05-01 NOTE — Anesthesia Postprocedure Evaluation (Signed)
Anesthesia Post Note  Patient: Genia HaroldHuong Nevils  Procedure(s) Performed: Procedure(s) (LRB): BILATERAL LAPAROSCOPIC TUBAL LIGATION (Bilateral)  Patient location during evaluation: PACU Anesthesia Type: General Level of consciousness: awake and alert Pain management: pain level controlled Vital Signs Assessment: post-procedure vital signs reviewed and stable Respiratory status: spontaneous breathing, nonlabored ventilation, respiratory function stable and patient connected to nasal cannula oxygen Cardiovascular status: blood pressure returned to baseline and stable Postop Assessment: no signs of nausea or vomiting Anesthetic complications: no     Last Vitals:  Filed Vitals:   05/01/16 1030 05/01/16 1119  BP: 115/83 118/79  Pulse: 72 73  Temp: 36.6 C   Resp: 18 18    Last Pain: There were no vitals filed for this visit. Pain Goal: Patients Stated Pain Goal: 3 (05/01/16 0601)               Cecile HearingStephen Edward Turk

## 2016-05-08 ENCOUNTER — Encounter (HOSPITAL_COMMUNITY): Payer: Self-pay | Admitting: Obstetrics and Gynecology
